# Patient Record
Sex: Male | Born: 1955 | Race: White | Hispanic: No | Marital: Married | Smoking: Never smoker
Health system: Southern US, Community
[De-identification: ages and names within clinical notes are randomized; demographics above are authoritative.]

## PROBLEM LIST (undated history)

## (undated) DIAGNOSIS — E119 Type 2 diabetes mellitus without complications: Secondary | ICD-10-CM

## (undated) DIAGNOSIS — E785 Hyperlipidemia, unspecified: Secondary | ICD-10-CM

---

## 2021-02-19 ENCOUNTER — Other Ambulatory Visit: Payer: Self-pay

## 2021-02-19 ENCOUNTER — Encounter (HOSPITAL_BASED_OUTPATIENT_CLINIC_OR_DEPARTMENT_OTHER): Payer: Self-pay

## 2021-02-19 DIAGNOSIS — N201 Calculus of ureter: Secondary | ICD-10-CM | POA: Diagnosis present

## 2021-02-19 DIAGNOSIS — K5730 Diverticulosis of large intestine without perforation or abscess without bleeding: Secondary | ICD-10-CM | POA: Insufficient documentation

## 2021-02-19 DIAGNOSIS — E119 Type 2 diabetes mellitus without complications: Secondary | ICD-10-CM | POA: Insufficient documentation

## 2021-02-19 DIAGNOSIS — N281 Cyst of kidney, acquired: Secondary | ICD-10-CM | POA: Insufficient documentation

## 2021-02-19 DIAGNOSIS — F17210 Nicotine dependence, cigarettes, uncomplicated: Secondary | ICD-10-CM | POA: Insufficient documentation

## 2021-02-19 DIAGNOSIS — N132 Hydronephrosis with renal and ureteral calculous obstruction: Secondary | ICD-10-CM | POA: Insufficient documentation

## 2021-02-19 NOTE — ED Triage Notes (Signed)
Bilateral flank pain for months.

## 2021-02-20 ENCOUNTER — Ambulatory Visit (HOSPITAL_COMMUNITY)
Admission: AD | Admit: 2021-02-20 | Discharge: 2021-02-20 | Disposition: A | Discharge status: Home / Self Care | Payer: 59 | Source: Ambulatory Visit | Attending: Urology | Admitting: Urology

## 2021-02-20 ENCOUNTER — Encounter (HOSPITAL_COMMUNITY)
Admission: AD | Disposition: A | Discharge status: Home / Self Care | Payer: Self-pay | Source: Ambulatory Visit | Attending: Urology

## 2021-02-20 ENCOUNTER — Ambulatory Visit (HOSPITAL_COMMUNITY): Payer: 59

## 2021-02-20 ENCOUNTER — Other Ambulatory Visit: Payer: Self-pay | Admitting: Urology

## 2021-02-20 ENCOUNTER — Encounter (HOSPITAL_COMMUNITY): Payer: Self-pay | Admitting: Urology

## 2021-02-20 ENCOUNTER — Emergency Department (HOSPITAL_BASED_OUTPATIENT_CLINIC_OR_DEPARTMENT_OTHER)
Admission: EM | Admit: 2021-02-20 | Discharge: 2021-02-20 | Disposition: A | Discharge status: Home / Self Care | Payer: 59 | Source: Home / Self Care | Attending: Emergency Medicine | Admitting: Emergency Medicine

## 2021-02-20 ENCOUNTER — Emergency Department (HOSPITAL_BASED_OUTPATIENT_CLINIC_OR_DEPARTMENT_OTHER): Payer: 59

## 2021-02-20 DIAGNOSIS — E119 Type 2 diabetes mellitus without complications: Secondary | ICD-10-CM | POA: Diagnosis not present

## 2021-02-20 DIAGNOSIS — N200 Calculus of kidney: Secondary | ICD-10-CM

## 2021-02-20 DIAGNOSIS — N281 Cyst of kidney, acquired: Secondary | ICD-10-CM | POA: Insufficient documentation

## 2021-02-20 DIAGNOSIS — K5730 Diverticulosis of large intestine without perforation or abscess without bleeding: Secondary | ICD-10-CM | POA: Diagnosis not present

## 2021-02-20 DIAGNOSIS — N132 Hydronephrosis with renal and ureteral calculous obstruction: Secondary | ICD-10-CM | POA: Diagnosis not present

## 2021-02-20 DIAGNOSIS — N201 Calculus of ureter: Secondary | ICD-10-CM

## 2021-02-20 HISTORY — DX: Type 2 diabetes mellitus without complications: E11.9

## 2021-02-20 HISTORY — DX: Hyperlipidemia, unspecified: E78.5

## 2021-02-20 HISTORY — PX: CYSTOSCOPY/URETEROSCOPY/HOLMIUM LASER/STENT PLACEMENT: SHX6546

## 2021-02-20 LAB — URINALYSIS, ROUTINE W REFLEX MICROSCOPIC
Bilirubin Urine: NEGATIVE
Glucose, UA: NEGATIVE mg/dL
Ketones, ur: NEGATIVE mg/dL
Leukocytes,Ua: NEGATIVE
Nitrite: NEGATIVE
Protein, ur: NEGATIVE mg/dL
Specific Gravity, Urine: 1.009 (ref 1.005–1.030)
pH: 5.5 (ref 5.0–8.0)

## 2021-02-20 LAB — CBC WITH DIFFERENTIAL/PLATELET
Abs Immature Granulocytes: 0.03 10*3/uL (ref 0.00–0.07)
Basophils Absolute: 0 10*3/uL (ref 0.0–0.1)
Basophils Relative: 0 %
Eosinophils Absolute: 0 10*3/uL (ref 0.0–0.5)
Eosinophils Relative: 0 %
HCT: 38.2 % — ABNORMAL LOW (ref 39.0–52.0)
Hemoglobin: 12.9 g/dL — ABNORMAL LOW (ref 13.0–17.0)
Immature Granulocytes: 0 %
Lymphocytes Relative: 15 %
Lymphs Abs: 1.6 10*3/uL (ref 0.7–4.0)
MCH: 28.9 pg (ref 26.0–34.0)
MCHC: 33.8 g/dL (ref 30.0–36.0)
MCV: 85.7 fL (ref 80.0–100.0)
Monocytes Absolute: 1 10*3/uL (ref 0.1–1.0)
Monocytes Relative: 10 %
Neutro Abs: 7.7 10*3/uL (ref 1.7–7.7)
Neutrophils Relative %: 75 %
Platelets: 152 10*3/uL (ref 150–400)
RBC: 4.46 MIL/uL (ref 4.22–5.81)
RDW: 12.8 % (ref 11.5–15.5)
WBC: 10.3 10*3/uL (ref 4.0–10.5)
nRBC: 0 % (ref 0.0–0.2)

## 2021-02-20 LAB — COMPREHENSIVE METABOLIC PANEL
ALT: 29 U/L (ref 0–44)
AST: 19 U/L (ref 15–41)
Albumin: 5 g/dL (ref 3.5–5.0)
Alkaline Phosphatase: 60 U/L (ref 38–126)
Anion gap: 12 (ref 5–15)
BUN: 22 mg/dL (ref 8–23)
CO2: 34 mmol/L — ABNORMAL HIGH (ref 22–32)
Calcium: 10 mg/dL (ref 8.9–10.3)
Chloride: 92 mmol/L — ABNORMAL LOW (ref 98–111)
Creatinine, Ser: 1.52 mg/dL — ABNORMAL HIGH (ref 0.61–1.24)
GFR, Estimated: 51 mL/min — ABNORMAL LOW (ref >60)
Glucose, Bld: 108 mg/dL — ABNORMAL HIGH (ref 70–99)
Potassium: 3.1 mmol/L — ABNORMAL LOW (ref 3.5–5.1)
Sodium: 138 mmol/L (ref 135–145)
Total Bilirubin: 1.5 mg/dL — ABNORMAL HIGH (ref 0.3–1.2)
Total Protein: 8.1 g/dL (ref 6.5–8.1)

## 2021-02-20 LAB — GLUCOSE, CAPILLARY
Glucose-Capillary: 151 mg/dL — ABNORMAL HIGH (ref 70–99)
Glucose-Capillary: 155 mg/dL — ABNORMAL HIGH (ref 70–99)

## 2021-02-20 LAB — LIPASE, BLOOD: Lipase: 40 U/L (ref 11–51)

## 2021-02-20 SURGERY — CYSTOSCOPY/URETEROSCOPY/HOLMIUM LASER/STENT PLACEMENT
Anesthesia: General | Site: Ureter | Laterality: Left

## 2021-02-20 MED ORDER — TAMSULOSIN HCL 0.4 MG PO CAPS: 0.4000 mg | ORAL_CAPSULE | Freq: Every day | ORAL | 0 refills | Status: AC

## 2021-02-20 MED ORDER — ONDANSETRON HCL 4 MG/2ML IJ SOLN
INTRAMUSCULAR | Status: DC | PRN
  Administered 2021-02-20: 4 mg via INTRAVENOUS

## 2021-02-20 MED ORDER — PROPOFOL 10 MG/ML IV BOLUS
INTRAVENOUS | Status: DC | PRN
  Administered 2021-02-20: 140 mg via INTRAVENOUS

## 2021-02-20 MED ORDER — DEXAMETHASONE SODIUM PHOSPHATE 10 MG/ML IJ SOLN
INTRAMUSCULAR | Status: DC | PRN
  Administered 2021-02-20: 10 mg via INTRAVENOUS

## 2021-02-20 MED ORDER — SODIUM CHLORIDE 0.9% FLUSH: 3.0000 mL | Freq: Two times a day (BID) | INTRAVENOUS | Status: DC

## 2021-02-20 MED ORDER — OXYCODONE HCL 5 MG PO TABS: 5.0000 mg | ORAL_TABLET | Freq: Once | ORAL | Status: DC | PRN

## 2021-02-20 MED ORDER — LIDOCAINE 2% (20 MG/ML) 5 ML SYRINGE
INTRAMUSCULAR | Status: DC | PRN
  Administered 2021-02-20: 100 mg via INTRAVENOUS

## 2021-02-20 MED ORDER — LACTATED RINGERS IV SOLN: INTRAVENOUS | Status: DC

## 2021-02-20 MED ORDER — ONDANSETRON HCL 4 MG/2ML IJ SOLN: 4.0000 mg | Freq: Once | INTRAMUSCULAR | Status: DC | PRN

## 2021-02-20 MED ORDER — ACETAMINOPHEN 10 MG/ML IV SOLN: 1000.0000 mg | Freq: Once | INTRAVENOUS | Status: DC | PRN

## 2021-02-20 MED ORDER — FENTANYL CITRATE (PF) 100 MCG/2ML IJ SOLN
INTRAMUSCULAR | Status: AC
  Filled 2021-02-20: qty 2

## 2021-02-20 MED ORDER — CEPHALEXIN 500 MG PO CAPS: 500.0000 mg | ORAL_CAPSULE | Freq: Three times a day (TID) | ORAL | 0 refills | Status: AC

## 2021-02-20 MED ORDER — CEFAZOLIN SODIUM-DEXTROSE 2-4 GM/100ML-% IV SOLN
2.0000 g | INTRAVENOUS | Status: AC
  Administered 2021-02-20: 2 g via INTRAVENOUS
  Filled 2021-02-20: qty 100

## 2021-02-20 MED ORDER — OXYCODONE-ACETAMINOPHEN 5-325 MG PO TABS: 1.0000 | ORAL_TABLET | Freq: Four times a day (QID) | ORAL | 0 refills | Status: AC | PRN

## 2021-02-20 MED ORDER — FENTANYL CITRATE PF 50 MCG/ML IJ SOSY
25.0000 ug | PREFILLED_SYRINGE | INTRAMUSCULAR | Status: DC | PRN
Start: 2021-02-20 — End: 2021-02-21

## 2021-02-20 MED ORDER — OXYCODONE HCL 5 MG/5ML PO SOLN: 5.0000 mg | Freq: Once | ORAL | Status: DC | PRN

## 2021-02-20 MED ORDER — SUCCINYLCHOLINE CHLORIDE 200 MG/10ML IV SOSY
PREFILLED_SYRINGE | INTRAVENOUS | Status: DC | PRN
  Administered 2021-02-20: 120 mg via INTRAVENOUS

## 2021-02-20 MED ORDER — MIDAZOLAM HCL 2 MG/2ML IJ SOLN
INTRAMUSCULAR | Status: AC
  Filled 2021-02-20: qty 2

## 2021-02-20 MED ORDER — CHLORHEXIDINE GLUCONATE 0.12 % MT SOLN
15.0000 mL | OROMUCOSAL | Status: AC
  Administered 2021-02-20: 15 mL via OROMUCOSAL

## 2021-02-20 MED ORDER — OXYCODONE-ACETAMINOPHEN 5-325 MG PO TABS
1.0000 | ORAL_TABLET | Freq: Once | ORAL | Status: AC
  Administered 2021-02-20: 1 via ORAL
  Filled 2021-02-20: qty 1

## 2021-02-20 MED ORDER — SODIUM CHLORIDE 0.9 % IR SOLN
Status: DC | PRN
  Administered 2021-02-20: 3000 mL
  Administered 2021-02-20: 6000 mL

## 2021-02-20 MED ORDER — MIDAZOLAM HCL 2 MG/2ML IJ SOLN
INTRAMUSCULAR | Status: DC | PRN
Start: 2021-02-20 — End: 2021-02-20
  Administered 2021-02-20: 2 mg via INTRAVENOUS

## 2021-02-20 MED ORDER — PHENYLEPHRINE HCL-NACL 20-0.9 MG/250ML-% IV SOLN
INTRAVENOUS | Status: DC | PRN
  Administered 2021-02-20: 50 ug/min via INTRAVENOUS

## 2021-02-20 MED ORDER — EPHEDRINE SULFATE-NACL 50-0.9 MG/10ML-% IV SOSY
PREFILLED_SYRINGE | INTRAVENOUS | Status: DC | PRN
  Administered 2021-02-20: 5 mg via INTRAVENOUS

## 2021-02-20 MED ORDER — PROPOFOL 10 MG/ML IV BOLUS
INTRAVENOUS | Status: AC
  Filled 2021-02-20: qty 20

## 2021-02-20 MED ORDER — FENTANYL CITRATE (PF) 100 MCG/2ML IJ SOLN
INTRAMUSCULAR | Status: DC | PRN
  Administered 2021-02-20 (×2): 50 ug via INTRAVENOUS

## 2021-02-20 MED ORDER — IOHEXOL 300 MG/ML  SOLN
INTRAMUSCULAR | Status: DC | PRN
  Administered 2021-02-20: 5 mL

## 2021-02-20 SURGICAL SUPPLY — 23 items
BAG URO CATCHER STRL LF (MISCELLANEOUS) ×2 IMPLANT
BASKET STONE NCOMPASS (UROLOGICAL SUPPLIES) IMPLANT
CATH URETERAL DUAL LUMEN 10F (MISCELLANEOUS) IMPLANT
CATH URETL OPEN 5X70 (CATHETERS) ×2 IMPLANT
CLOTH BEACON ORANGE TIMEOUT ST (SAFETY) ×2 IMPLANT
EXTRACTOR STONE NITINOL NGAGE (UROLOGICAL SUPPLIES) ×2 IMPLANT
GLOVE SURG POLYISO LF SZ8 (GLOVE) ×2 IMPLANT
GOWN STRL REUS W/TWL XL LVL3 (GOWN DISPOSABLE) ×2 IMPLANT
GUIDEWIRE STR DUAL SENSOR (WIRE) ×4 IMPLANT
IV NS IRRIG 3000ML ARTHROMATIC (IV SOLUTION) ×2 IMPLANT
KIT TURNOVER KIT A (KITS) IMPLANT
LASER FIB FLEXIVA PULSE ID 365 (Laser) ×2 IMPLANT
LASER FIB FLEXIVA PULSE ID 550 (Laser) IMPLANT
LASER FIB FLEXIVA PULSE ID 910 (Laser) IMPLANT
MANIFOLD NEPTUNE II (INSTRUMENTS) ×2 IMPLANT
PACK CYSTO (CUSTOM PROCEDURE TRAY) ×2 IMPLANT
SHEATH NAVIGATOR HD 11/13X36 (SHEATH) IMPLANT
SHEATH URETERAL 12FR 45CM (SHEATH) ×2 IMPLANT
STENT URET 6FRX24 CONTOUR (STENTS) ×2 IMPLANT
TRACTIP FLEXIVA PULS ID 200XHI (Laser) IMPLANT
TRACTIP FLEXIVA PULSE ID 200 (Laser)
TUBING CONNECTING 10 (TUBING) ×2 IMPLANT
TUBING UROLOGY SET (TUBING) ×2 IMPLANT

## 2021-02-20 NOTE — Anesthesia Preprocedure Evaluation (Addendum)
Anesthesia Evaluation  Patient identified by MRN, date of birth, ID band Patient awake    Reviewed: Allergy & Precautions, H&P , NPO status , Patient's Chart, lab work & pertinent test results  Airway Mallampati: II  TM Distance: >3 FB Neck ROM: Full    Dental no notable dental hx.    Pulmonary neg pulmonary ROS,    Pulmonary exam normal breath sounds clear to auscultation       Cardiovascular negative cardio ROS Normal cardiovascular exam Rhythm:Regular Rate:Normal     Neuro/Psych negative neurological ROS  negative psych ROS   GI/Hepatic negative GI ROS, Neg liver ROS,   Endo/Other  diabetes  Renal/GU Renal diseaseAcute renal injury causing increased Cr  negative genitourinary   Musculoskeletal negative musculoskeletal ROS (+)   Abdominal   Peds negative pediatric ROS (+)  Hematology negative hematology ROS (+)   Anesthesia Other Findings   Reproductive/Obstetrics negative OB ROS                            Anesthesia Physical Anesthesia Plan  ASA: 2  Anesthesia Plan: General   Post-op Pain Management:    Induction: Intravenous  PONV Risk Score and Plan: 2 and Ondansetron, Dexamethasone and Treatment may vary due to age or medical condition  Airway Management Planned: Oral ETT  Additional Equipment:   Intra-op Plan:   Post-operative Plan: Extubation in OR  Informed Consent: I have reviewed the patients History and Physical, chart, labs and discussed the procedure including the risks, benefits and alternatives for the proposed anesthesia with the patient or authorized representative who has indicated his/her understanding and acceptance.     Dental advisory given  Plan Discussed with: CRNA and Surgeon  Anesthesia Plan Comments: (Solids at 1000)       Anesthesia Quick Evaluation

## 2021-02-20 NOTE — ED Notes (Signed)
Pt states he has had bilateral flank pain for a few months. States today has been the worst the pain has been and is constant.  Normally states it comes and goes.  Urine obtained and lab work has been sent

## 2021-02-20 NOTE — Anesthesia Procedure Notes (Signed)
Procedure Name: Intubation Date/Time: 02/20/2021 8:10 PM Performed by: Illene Silver, CRNA Pre-anesthesia Checklist: Patient identified, Emergency Drugs available, Suction available and Patient being monitored Patient Re-evaluated:Patient Re-evaluated prior to induction Oxygen Delivery Method: Circle system utilized Preoxygenation: Pre-oxygenation with 100% oxygen Induction Type: IV induction, Rapid sequence and Cricoid Pressure applied Ventilation: Mask ventilation without difficulty Laryngoscope Size: 2 and Miller Grade View: Grade I Tube type: Oral Number of attempts: 1 Airway Equipment and Method: Stylet Placement Confirmation: ETT inserted through vocal cords under direct vision, positive ETCO2 and breath sounds checked- equal and bilateral Secured at: 22 cm Tube secured with: Tape Dental Injury: Teeth and Oropharynx as per pre-operative assessment  Comments: Smooth IV induction Rose- intubation AM CRNA atraumatic--- teeth and mouth as preop bilat BS

## 2021-02-20 NOTE — H&P (Signed)
I have ureteral stone.  HPI: Brendan Sandoval is a 65 year-old male patient who was referred by Dr. Thayer Jew, MD who is here for ureteral stone.    Brendan Sandoval is a 65 yo male who has had a 2 mo history of intermittent left flank pain. Yesterday he had more severe pain that became progressively worse despite pain med. He had nausea without vomiting. he had no fever. He had no hematuria. He has had no prior stones or GU surgery but he may have had a UTI remotely. He has an IPSS of 18 with some frequency and nocturia. He drinks a lot of fluid in the morning. His Cr was elevated at 1.52 in the ER. His baseline in 9/22 was 1.15. His UA is clear.      ALLERGIES: None   MEDICATIONS: Crestor  Janumet     GU PSH: None   NON-GU PSH: None   GU PMH: None   NON-GU PMH: Diabetes Type 2 Hypercholesterolemia    FAMILY HISTORY: 2 sons - Other 4 daughters - Other Death In The Family Father - Other Death In The Family Mother - Other   SOCIAL HISTORY: Marital Status: Married Preferred Language: English Current Smoking Status: Patient has never smoked.   Tobacco Use Assessment Completed: Used Tobacco in last 30 days? Does not use smokeless tobacco. Has never drank.  Does not use drugs. Drinks 2 caffeinated drinks per day. Patient's occupation Company secretary.    REVIEW OF SYSTEMS:    GU Review Male:   Patient reports frequent urination and get up at night to urinate. Patient denies hard to postpone urination, burning/ pain with urination, leakage of urine, stream starts and stops, trouble starting your stream, have to strain to urinate , erection problems, and penile pain.  Gastrointestinal (Upper):   Patient denies nausea, vomiting, and indigestion/ heartburn.  Gastrointestinal (Lower):   Patient denies diarrhea and constipation.  Constitutional:   Patient denies fever, night sweats, weight loss, and fatigue.  Skin:   Patient denies skin rash/ lesion and itching.  Eyes:   Patient denies blurred  vision and double vision.  Ears/ Nose/ Throat:   Patient denies sore throat and sinus problems.  Hematologic/Lymphatic:   Patient denies swollen glands and easy bruising.  Cardiovascular:   Patient denies leg swelling and chest pains.  Respiratory:   Patient denies shortness of breath and cough.  Endocrine:   Patient denies excessive thirst.  Musculoskeletal:   Patient reports back pain. Patient denies joint pain.  Neurological:   Patient denies headaches and dizziness.  Psychologic:   Patient denies depression and anxiety.   Notes: Weak stream    VITAL SIGNS:      02/20/2021 11:13 AM  Weight 132 lb / 59.87 kg  Height 66 in / 167.64 cm  BP 126/77 mmHg  Heart Rate 70 /min  Temperature 97.7 F / 36.5 C  BMI 21.3 kg/m   MULTI-SYSTEM PHYSICAL EXAMINATION:    Constitutional: Well-nourished. No physical deformities. Normally developed. Good grooming.  Neck: Neck symmetrical, not swollen. Normal tracheal position.  Respiratory: Normal breath sounds. No labored breathing, no use of accessory muscles.   Cardiovascular: Regular rate and rhythm. No murmur, no gallop.   Lymphatic: No enlargement of neck, axillae, groin.  Skin: No paleness, no jaundice, no cyanosis. No lesion, no ulcer, no rash.  Neurologic / Psychiatric: Oriented to time, oriented to place, oriented to person. No depression, no anxiety, no agitation.  Gastrointestinal: No mass, no tenderness, no rigidity, non  obese abdomen.   Musculoskeletal: Normal gait and station of head and neck.     Complexity of Data:  Lab Test Review:   BMP, CBC with Diff  Records Review:   AUA Symptom Score, Previous Patient Records  Urine Test Review:   Urinalysis  X-Ray Review: C.T. Stone Protocol: Reviewed Films. Reviewed Report. Discussed With Patient.    Notes:                     his K was was 3.1 and his Hgb was 12.9.    PROCEDURES: None   ASSESSMENT:      ICD-10 Details  1 GU:   Ureteral calculus - N20.1 Left, Acute, Systemic  Symptoms - He has a 41mm left mid ureteral stone with obstruction and AKI. I discussed options including MET, ESWL and URS and will get him set up for URS today. I have reviewed the risks of ureteroscopy including bleeding, infection, ureteral injury, need for a stent or secondary procedures, thrombotic events and anesthetic complications.    PLAN:           Schedule Procedure: 02/20/2021 - Cysto Uretero Lithotripsy - [id number], left          Document Letter(s):  Created for Patient: Clinical Summary         Next Appointment:      Next Appointment: 03/01/2021 02:45 PM    Appointment Type: Postoperative Appointment    Location: [facility] [phone]    Provider: Jiles Crocker, NP    Reason for Visit: POST OP

## 2021-02-20 NOTE — Discharge Instructions (Signed)
Please bring the stone fragments to the office.  We will need to remove the stent when you come to the office on 12/9.   The stone was quite stuck and was associated with some inflammation so the stent needs to stay in until your return.  Please take the tamsulosin you were given as that can help with stent discomfort.

## 2021-02-20 NOTE — ED Provider Notes (Signed)
[facility] EMERGENCY DEPT Provider Note   CSN: [id number] Arrival date & time: 02/19/21  1955     History Chief Complaint  Patient presents with   Flank Pain    Brendan Sandoval is a 65 y.o. male.  HPI     This is a 65 year old male with history of diabetes who presents with flank pain.  Patient reports he has had on and off flank pain over the last 1 month.  He states it has been bilateral but more on the left side than the right.  It comes and goes.  However over the last 24 hours it has been increasingly worse.  He states it radiates into the abdomen.  Has not noted any hematuria or dysuria.  He states he cannot get comfortable in any position.  He does not think that movement or position changes make his symptoms better or worse.  He denies fevers.  Currently rates pain at 8 out of 10.  Past Medical History:  Diagnosis Date   Diabetes mellitus without complication ([place])    Hyperlipidemia     There are no problems to display for this patient.   History reviewed. No pertinent surgical history.     No family history on file.  Social History   Tobacco Use   Smoking status: Never  Substance Use Topics   Alcohol use: Never   Drug use: Never    Home Medications Prior to Admission medications   Medication Sig Start Date End Date Taking? Authorizing Provider  oxyCODONE-acetaminophen (PERCOCET/ROXICET) 5-325 MG tablet Take 1 tablet by mouth every 6 (six) hours as needed for severe pain. 02/20/21  Yes Heylee Tant, Barbette Hair, MD  tamsulosin (FLOMAX) 0.4 MG CAPS capsule Take 1 capsule (0.4 mg total) by mouth daily. 02/20/21  Yes Frank Pilger, Barbette Hair, MD    Allergies    Patient has no known allergies.  Review of Systems   Review of Systems  Constitutional:  Negative for fever.  Respiratory:  Negative for shortness of breath.   Cardiovascular:  Negative for chest pain.  Gastrointestinal:  Positive for abdominal pain. Negative for nausea and vomiting.   Genitourinary:  Positive for flank pain. Negative for dysuria and hematuria.  All other systems reviewed and are negative.  Physical Exam Updated Vital Signs BP 129/69 (BP Location: Right Arm)   Pulse 61   Temp 98.5 F (36.9 C)   Resp 16   Ht 1.676 m (5\' 6" )   Wt 65.8 kg   SpO2 98%   BMI 23.40 kg/m   Physical Exam Vitals and nursing note reviewed.  Constitutional:      Appearance: He is well-developed. He is not ill-appearing.  HENT:     Head: Normocephalic and atraumatic.     Nose: Nose normal.     Mouth/Throat:     Mouth: Mucous membranes are moist.  Eyes:     Pupils: Pupils are equal, round, and reactive to light.  Cardiovascular:     Rate and Rhythm: Normal rate and regular rhythm.     Heart sounds: Normal heart sounds. No murmur heard. Pulmonary:     Effort: Pulmonary effort is normal. No respiratory distress.     Breath sounds: Normal breath sounds. No wheezing.  Abdominal:     Palpations: Abdomen is soft.     Tenderness: There is no abdominal tenderness. There is left CVA tenderness. There is no right CVA tenderness or rebound.  Musculoskeletal:     Cervical back: Neck supple.  Lymphadenopathy:  Cervical: No cervical adenopathy.  Skin:    General: Skin is warm and dry.  Neurological:     Mental Status: He is alert and oriented to person, place, and time.  Psychiatric:        Mood and Affect: Mood normal.    ED Results / Procedures / Treatments   Labs (all labs ordered are listed, but only abnormal results are displayed) Labs Reviewed  CBC WITH DIFFERENTIAL/PLATELET - Abnormal; Notable for the following components:      Result Value   Hemoglobin 12.9 (*)    HCT 38.2 (*)    All other components within normal limits  URINALYSIS, ROUTINE W REFLEX MICROSCOPIC - Abnormal; Notable for the following components:   Color, Urine COLORLESS (*)    Hgb urine dipstick SMALL (*)    All other components within normal limits  COMPREHENSIVE METABOLIC PANEL -  Abnormal; Notable for the following components:   Potassium 3.1 (*)    Chloride 92 (*)    CO2 34 (*)    Glucose, Bld 108 (*)    Creatinine, Ser 1.52 (*)    Total Bilirubin 1.5 (*)    GFR, Estimated 51 (*)    All other components within normal limits  LIPASE, BLOOD    EKG None  Radiology CT Renal Stone Study  Result Date: 02/20/2021 CLINICAL DATA:  Left flank pain. EXAM: CT ABDOMEN AND PELVIS WITHOUT CONTRAST TECHNIQUE: Multidetector CT imaging of the abdomen and pelvis was performed following the standard protocol without IV contrast. COMPARISON:  None. FINDINGS: Lower chest: There is a 4 mm noncalcified pleural-based left lower lobe nodule laterally on the first image. Lung bases are otherwise clear. The cardiac size is normal. Hepatobiliary: 18.3 cm in length slightly steatotic and otherwise unremarkable without contrast. The gallbladder and bile ducts are unremarkable. Pancreas: Unremarkable without contrast. Spleen: Mildly prominent measuring 13.4 cm AP and otherwise are without contrast. Adrenals/Urinary Tract: There is no adrenal mass. No focal abnormality of unenhanced renal cortex aside from a 2.2 cm cyst of the right inferior pole of 8.6 Hounsfield units, a 1.7 cm cyst in the superior pole left kidney of 11.5 Hounsfield units, and a 1.1 cm cyst in the inferior pole left kidney of 5.9 Hounsfield units. On the left there is perinephric edema and moderate hydroureteronephrosis above a 6.6 x 5 by 7 mm stone in the pelvic left ureter 5-6 cm proximal to the UVJ, with mild periureteral and peripelvic edema. There are no other visible urinary stones. There is mild bladder thickening versus nondistention. No focal bladder thickening is seen. Stomach/Bowel: No dilatation or wall thickening including the appendix. Moderate fecal stasis. Left colonic diverticulosis is seen without evidence of acute diverticulitis. Vascular/Lymphatic: No significant vascular findings are present. No enlarged abdominal  or pelvic lymph nodes. Reproductive: Enlarged prostate, 5.3 cm transverse with bladder base impression. Other: There is trace retroperitoneal fluid on the left which is probably related to obstructive uropathy. Possible ureteral leak could cause this as well but there is no further free fluid. There is a small umbilical fat hernia. There is no free air or free hemorrhage Musculoskeletal: No worrisome regional bone lesions. There is a bone island in the left femoral head. There are mild degenerative changes in the lumbar spine. IMPRESSION: 1. 6.6 x 5 x 7 mm left ureteral stone in the pelvis 5-6 cm proximal to the UVJ. 2. Moderate upstream obstructive uropathy with peripelvic and periureteral edema with perinephric edema. Please correlate clinically for infectious complication. 3.  Trace left retroperitoneal fluid which could be due to the obstructive uropathy but could also be seen with a ureteral leak, although the fluid volume should be greater with a ureteral leak. 4. Prostatomegaly with bladder base impression and mild bladder thickening versus nondistention. 5. Constipation with diverticulosis. 6. Renal cysts.  No other urinary stone is seen in either side. 7. Mildly prominent liver and spleen. 8. 4 mm noncalcified left lower lobe nodule. No imaging follow-up is required in a low risk patient. In a high-risk patient, optional 12 month follow-up CT could be considered to ensure stability. Electronically Signed   By: Telford Nab M.D.   On: 02/20/2021 02:47    Procedures Procedures   Medications Ordered in ED Medications  oxyCODONE-acetaminophen (PERCOCET/ROXICET) 5-325 MG per tablet 1 tablet (1 tablet Oral Given 02/20/21 0039)    ED Course  I have reviewed the triage vital signs and the nursing notes.  Pertinent labs & imaging results that were available during my care of the patient were reviewed by me and considered in my medical decision making (see chart for details).  Clinical Course as of  02/20/21 0325  Wed Feb 20, 2021  0309 Patient feels much improved. [CH]    Clinical Course User Index [CH] Bartley Vuolo, Barbette Hair, MD   MDM Rules/Calculators/A&P                           Patient presents with flank pain.  Left greater than right.  He is overall nontoxic and vital signs reassuring.  He has some left CVA tenderness.  Considerations include but not limited to, UTI or pyelonephritis, kidney stone, musculoskeletal etiology.  Labs obtained.  Labs are largely unremarkable.  He does have a creatinine of 1.5 without unknown baseline.  CT stone study obtained.  CT shows a 6.6 x 5 x 7 mm left ureteral stone with some obstructive uropathy and perinephric stranding.  No evidence of UTI on his urinalysis.  He has some trace retroperitoneal fluid.  I favor obstructive uropathy versus ureteral leak as patient is very clinically well-appearing.  On recheck, he states he feels much better with pain medication.  Discussed the CT findings with the patient.  Recommend close outpatient follow-up with urology.  In the meantime will start on Flomax and pain medication.  Patient stated understanding.  After history, exam, and medical workup I feel the patient has been appropriately medically screened and is safe for discharge home. Pertinent diagnoses were discussed with the patient. Patient was given return precautions.  Final Clinical Impression(s) / ED Diagnoses Final diagnoses:  Kidney stone    Rx / DC Orders ED Discharge Orders          Ordered    tamsulosin (FLOMAX) 0.4 MG CAPS capsule  Daily        02/20/21 0311    oxyCODONE-acetaminophen (PERCOCET/ROXICET) 5-325 MG tablet  Every 6 hours PRN        02/20/21 0311             Merryl Hacker, MD 02/20/21 [phone]

## 2021-02-20 NOTE — Op Note (Signed)
Procedure: 1.  Cystoscopy with left retrograde pyelogram and interpretation. 2.  Left ureteroscopic stone extraction with holmium laser application and insertion of left double-J stent. 3.  Application of fluoroscopy.  Preop diagnosis: 7 mm left mid ureteral stone.  Postop diagnosis: Same.  Surgeon: Dr. Bjorn Pippin.  Anesthesia: General.  Specimen: Stone fragments.  Drain: 6 [place] by 24 cm left double-J stent without tether.  EBL: None.  Complications: None.  Indications: The patient is a 65 year old male who was seen in the office today for 7 mm obstructing left mid ureteral stone with intermittent pain and AKI.  He had had intermittent bouts of pain for about 2 months and on CT had moderate hydronephrosis and evidence of possible periureteral extravasation.  I reviewed the options for treatment and elected to proceed with ureteroscopy today.  Procedure: He was taken operating room was given 2 g of Ancef.  A general anesthetic was induced.  He was placed in lithotomy position and fitted with PAS hose.  His perineum and genitalia were prepped with Betadine solution he was draped in usual sterile fashion.  Cystoscopy was performed using a 21 [place] scope and 30 degree lens.  Examination revealed a normal urethra.  The external sphincter was intact.  The prostatic urethra short bilobar hyperplasia without significant obstruction.  The bladder wall was smooth and pale without tumors, stones or inflammation.  There was a small amount of debris that looked like old blood floating in the bladder.  The ureteral orifices were unremarkable.  The left ureteral orifice was cannulated with a 5 [place] open-ended catheter and contrast was instilled.  The left retrograde pyelogram demonstrated a normal caliber distal ureter up to the level of the stone which was quite impacted and did not allow proximal reflux of contrast.  A sensor wire was then advanced through the open-ended catheter and I was able  to negotiate this by the stone to the kidney but I could not pass the open-ended catheter by the impacted stone.  The 12 [place] inner core of the ureteral access sheath was then passed over the wire after the cystoscope was then removed and the distal ureter was dilated but once again I could only get to the stone and not by it.  The dual-lumen 6.5 French short semirigid ureteroscope was then passed per urethra alongside the wire up the ureter.  The stone was visualized but it was at an angle in the ureter and was somewhat difficult to approach.  Upon inspection of the wire there was some concern that it may be have passed submucosally, so a second wire was then passed through the scope and by the stone under direct vision and advanced to the kidney.  Ureteroscope was then removed and replaced alongside both wires and the stone was more readily visualized.  A 365 m laser fiber was then passed and the Moses laser was set on the dusting setting with 0.3 J and 60 Hz on the left paddle and 0.8 J and 10 Hz on the right pedal.  The stone was then fragmented primarily using the dusting setting.  It was difficult to get the scope angled to send her on the stone, so worked up at the edges and eventually broken into fragments where I could push it more proximally in the ureter.  There was some inflammatory changes and mucosal irritation at the site of the stone impaction.  Eventually I removed one of the 2 wires to improve the available space and grasped the  stone with an engage basket to bring it into proximity with the ureter for further fragmentation.  The stone fragments were then removed using the engage basket to the bladder.  The bladder was drained intermittently.  Once final inspection demonstrated no significant residual stone fragments and also that the wire was not submucosal and appropriately positioned, the ureteroscope was removed.  The cystoscope was then reinserted over the wire and a 6 [place] by  24 cm contour double-J stent without tether was advanced the kidney under fluoroscopic guidance.  The wire was removed, leaving a good coil in the kidney and a good coil in the bladder.  The bladder was evacuated free of the stone fragments and the cystoscope was removed.  Final fluoroscopy revealed good positioning of the stent proximally and distally.  He was taken down from lithotomy position, his anesthetic was reversed and he was moved recovery in stable condition.  There were no complications.  He will be given the stone fragments to bring to the office for follow-up on 03/01/2021 at which time he will have cystoscopy to remove the stent.

## 2021-02-20 NOTE — Transfer of Care (Signed)
Immediate Anesthesia Transfer of Care Note  Patient: Butler A Goodspeed  Procedure(s) Performed: CYSTOSCOPY/RETROGRADE PYLOGRAM/URETEROSCOPY/HOLMIUM LASER/STENT PLACEMENT (Left: Ureter)  Patient Location: PACU  Anesthesia Type:General  Level of Consciousness: awake and alert   Airway & Oxygen Therapy: Patient Spontanous Breathing and Patient connected to face mask oxygen  Post-op Assessment: Report given to RN and Post -op Vital signs reviewed and stable  Post vital signs: Reviewed and stable  Last Vitals:  Vitals Value Taken Time  BP 140/75 02/20/21 2115  Temp    Pulse 88 02/20/21 2115  Resp 17 02/20/21 2115  SpO2 98 % 02/20/21 2115  Vitals shown include unvalidated device data.  Last Pain:  Vitals:   02/20/21 1641  TempSrc: Oral  PainSc: 5          Complications: No notable events documented.

## 2021-02-20 NOTE — Discharge Instructions (Addendum)
You were seen today for back pain.  You do have evidence of kidney stones.  Take medications as prescribed.  Make sure you are drinking plenty of fluids.  It is very important that you call the urology office for follow-up.

## 2021-02-21 ENCOUNTER — Encounter (HOSPITAL_COMMUNITY): Payer: Self-pay | Admitting: Urology

## 2021-02-22 NOTE — Anesthesia Postprocedure Evaluation (Signed)
Anesthesia Post Note  Patient: Brendan Sandoval  Procedure(s) Performed: CYSTOSCOPY/RETROGRADE PYLOGRAM/URETEROSCOPY/HOLMIUM LASER/STENT PLACEMENT (Left: Ureter)     Patient location during evaluation: PACU Anesthesia Type: General Level of consciousness: awake and alert Pain management: pain level controlled Vital Signs Assessment: post-procedure vital signs reviewed and stable Respiratory status: spontaneous breathing, nonlabored ventilation, respiratory function stable and patient connected to nasal cannula oxygen Cardiovascular status: blood pressure returned to baseline and stable Postop Assessment: no apparent nausea or vomiting Anesthetic complications: no   No notable events documented.  Last Vitals:  Vitals:   02/20/21 2200 02/20/21 2220  BP: 122/72 134/74  Pulse: 91 89  Resp: 20 18  Temp:  36.5 C  SpO2: 94% 95%    Last Pain:  Vitals:   02/20/21 2220  TempSrc:   PainSc: 0-No pain                 Toniyah Dilmore S

## 2022-06-04 ENCOUNTER — Emergency Department (HOSPITAL_COMMUNITY)
Admission: EM | Admit: 2022-06-04 | Discharge: 2022-06-05 | Disposition: A | Discharge status: Home / Self Care | Payer: Medicare Other | Attending: Physician Assistant | Admitting: Physician Assistant

## 2022-06-04 ENCOUNTER — Encounter (HOSPITAL_COMMUNITY): Payer: Self-pay | Admitting: Emergency Medicine

## 2022-06-04 ENCOUNTER — Other Ambulatory Visit: Payer: Self-pay

## 2022-06-04 DIAGNOSIS — I10 Essential (primary) hypertension: Secondary | ICD-10-CM | POA: Insufficient documentation

## 2022-06-04 DIAGNOSIS — R519 Headache, unspecified: Secondary | ICD-10-CM | POA: Diagnosis present

## 2022-06-04 DIAGNOSIS — Z5321 Procedure and treatment not carried out due to patient leaving prior to being seen by health care provider: Secondary | ICD-10-CM | POA: Diagnosis not present

## 2022-06-04 LAB — COMPREHENSIVE METABOLIC PANEL
ALT: 19 U/L (ref 0–44)
AST: 16 U/L (ref 15–41)
Albumin: 3.8 g/dL (ref 3.5–5.0)
Alkaline Phosphatase: 38 U/L (ref 38–126)
Anion gap: 13 (ref 5–15)
BUN: 18 mg/dL (ref 8–23)
CO2: 22 mmol/L (ref 22–32)
Calcium: 9.6 mg/dL (ref 8.9–10.3)
Chloride: 98 mmol/L (ref 98–111)
Creatinine, Ser: 1.38 mg/dL — ABNORMAL HIGH (ref 0.61–1.24)
GFR, Estimated: 57 mL/min — ABNORMAL LOW (ref >60)
Glucose, Bld: 248 mg/dL — ABNORMAL HIGH (ref 70–99)
Potassium: 4.2 mmol/L (ref 3.5–5.1)
Sodium: 133 mmol/L — ABNORMAL LOW (ref 135–145)
Total Bilirubin: 1.3 mg/dL — ABNORMAL HIGH (ref 0.3–1.2)
Total Protein: 7.1 g/dL (ref 6.5–8.1)

## 2022-06-04 LAB — CBC WITH DIFFERENTIAL/PLATELET
Abs Immature Granulocytes: 0.04 10*3/uL (ref 0.00–0.07)
Basophils Absolute: 0 10*3/uL (ref 0.0–0.1)
Basophils Relative: 0 %
Eosinophils Absolute: 0 10*3/uL (ref 0.0–0.5)
Eosinophils Relative: 0 %
HCT: 43.6 % (ref 39.0–52.0)
Hemoglobin: 15.3 g/dL (ref 13.0–17.0)
Immature Granulocytes: 0 %
Lymphocytes Relative: 18 %
Lymphs Abs: 2.2 10*3/uL (ref 0.7–4.0)
MCH: 30.4 pg (ref 26.0–34.0)
MCHC: 35.1 g/dL (ref 30.0–36.0)
MCV: 86.7 fL (ref 80.0–100.0)
Monocytes Absolute: 0.9 10*3/uL (ref 0.1–1.0)
Monocytes Relative: 7 %
Neutro Abs: 9.2 10*3/uL — ABNORMAL HIGH (ref 1.7–7.7)
Neutrophils Relative %: 75 %
Platelets: 178 10*3/uL (ref 150–400)
RBC: 5.03 MIL/uL (ref 4.22–5.81)
RDW: 13.2 % (ref 11.5–15.5)
WBC: 12.4 10*3/uL — ABNORMAL HIGH (ref 4.0–10.5)
nRBC: 0 % (ref 0.0–0.2)

## 2022-06-04 NOTE — ED Provider Triage Note (Cosign Needed)
Emergency Medicine Provider Triage Evaluation Note  Brendan Sandoval , a 67 y.o. male  was evaluated in triage.  Pt complains of headache which worsened today.  Reports he went to work this morning and got back home when he felt a right temporal tightness, states he noticed his blood pressure was more elevated than normal.  He was placed on steroids recently, he is concerned that this is likely raising his blood pressure along with his blood sugar.  Review of Systems  Positive: Headache Negative: Vision changes, neck pain  Physical Exam  BP (!) 149/90   Pulse 77   Temp 98.6 F (37 C)   Resp 16   Ht '5\' 6"'$  (1.676 m)   Wt 67.1 kg   SpO2 98%   BMI 23.89 kg/m  Gen:   Awake, no distress   Resp:  Normal effort  MSK:   Moves extremities without difficulty  Other:    Medical Decision Making  Medically screening exam initiated at 9:30 PM.  Appropriate orders placed.  Brendan Sandoval was informed that the remainder of the evaluation will be completed by another provider, this initial triage assessment does not replace that evaluation, and the importance of remaining in the ED until their evaluation is complete.     Brendan Fitting, PA-C 06/04/22 2138

## 2022-06-04 NOTE — ED Triage Notes (Addendum)
Pt states he took his bp at home and it was 180/87. Endorses ongoing headache x4 months. Pt describes headache as tension on right side. Pt scheduled to have bilateral temporal artery biopsy done on 3/15. Denies vision changes. Denies shob, chest pain. No neuro deficits.

## 2022-08-29 ENCOUNTER — Other Ambulatory Visit: Payer: Self-pay

## 2022-08-29 ENCOUNTER — Emergency Department (HOSPITAL_COMMUNITY)
Admission: EM | Admit: 2022-08-29 | Discharge: 2022-08-29 | Disposition: A | Discharge status: Home / Self Care | Payer: Medicare Other | Attending: Emergency Medicine | Admitting: Emergency Medicine

## 2022-08-29 ENCOUNTER — Emergency Department (HOSPITAL_COMMUNITY): Payer: Medicare Other

## 2022-08-29 ENCOUNTER — Encounter (HOSPITAL_COMMUNITY): Payer: Self-pay | Admitting: Emergency Medicine

## 2022-08-29 DIAGNOSIS — S065XAA Traumatic subdural hemorrhage with loss of consciousness status unknown, initial encounter: Secondary | ICD-10-CM

## 2022-08-29 DIAGNOSIS — R4781 Slurred speech: Secondary | ICD-10-CM | POA: Insufficient documentation

## 2022-08-29 DIAGNOSIS — I6200 Nontraumatic subdural hemorrhage, unspecified: Secondary | ICD-10-CM | POA: Diagnosis not present

## 2022-08-29 DIAGNOSIS — R531 Weakness: Secondary | ICD-10-CM | POA: Diagnosis present

## 2022-08-29 LAB — RAPID URINE DRUG SCREEN, HOSP PERFORMED
Amphetamines: NOT DETECTED
Barbiturates: NOT DETECTED
Benzodiazepines: NOT DETECTED
Cocaine: NOT DETECTED
Opiates: NOT DETECTED
Tetrahydrocannabinol: NOT DETECTED

## 2022-08-29 LAB — DIFFERENTIAL
Abs Immature Granulocytes: 0.01 10*3/uL (ref 0.00–0.07)
Basophils Absolute: 0 10*3/uL (ref 0.0–0.1)
Basophils Relative: 0 %
Eosinophils Absolute: 0.1 10*3/uL (ref 0.0–0.5)
Eosinophils Relative: 1 %
Immature Granulocytes: 0 %
Lymphocytes Relative: 28 %
Lymphs Abs: 2.2 10*3/uL (ref 0.7–4.0)
Monocytes Absolute: 0.6 10*3/uL (ref 0.1–1.0)
Monocytes Relative: 7 %
Neutro Abs: 5 10*3/uL (ref 1.7–7.7)
Neutrophils Relative %: 64 %

## 2022-08-29 LAB — CBC
HCT: 43.5 % (ref 39.0–52.0)
Hemoglobin: 14.5 g/dL (ref 13.0–17.0)
MCH: 29.8 pg (ref 26.0–34.0)
MCHC: 33.3 g/dL (ref 30.0–36.0)
MCV: 89.3 fL (ref 80.0–100.0)
Platelets: 166 10*3/uL (ref 150–400)
RBC: 4.87 MIL/uL (ref 4.22–5.81)
RDW: 12.7 % (ref 11.5–15.5)
WBC: 7.8 10*3/uL (ref 4.0–10.5)
nRBC: 0 % (ref 0.0–0.2)

## 2022-08-29 LAB — COMPREHENSIVE METABOLIC PANEL
ALT: 11 U/L (ref 0–44)
AST: 21 U/L (ref 15–41)
Albumin: 3.8 g/dL (ref 3.5–5.0)
Alkaline Phosphatase: 40 U/L (ref 38–126)
Anion gap: 10 (ref 5–15)
BUN: 11 mg/dL (ref 8–23)
CO2: 20 mmol/L — ABNORMAL LOW (ref 22–32)
Calcium: 8.9 mg/dL (ref 8.9–10.3)
Chloride: 106 mmol/L (ref 98–111)
Creatinine, Ser: 1.03 mg/dL (ref 0.61–1.24)
GFR, Estimated: >60 mL/min (ref >60)
Glucose, Bld: 156 mg/dL — ABNORMAL HIGH (ref 70–99)
Potassium: 4 mmol/L (ref 3.5–5.1)
Sodium: 136 mmol/L (ref 135–145)
Total Bilirubin: 0.7 mg/dL (ref 0.3–1.2)
Total Protein: 6.9 g/dL (ref 6.5–8.1)

## 2022-08-29 LAB — I-STAT CHEM 8, ED
BUN: 13 mg/dL (ref 8–23)
Calcium, Ion: 1 mmol/L — ABNORMAL LOW (ref 1.15–1.40)
Chloride: 106 mmol/L (ref 98–111)
Creatinine, Ser: 1 mg/dL (ref 0.61–1.24)
Glucose, Bld: 159 mg/dL — ABNORMAL HIGH (ref 70–99)
HCT: 42 % (ref 39.0–52.0)
Hemoglobin: 14.3 g/dL (ref 13.0–17.0)
Potassium: 4 mmol/L (ref 3.5–5.1)
Sodium: 140 mmol/L (ref 135–145)
TCO2: 24 mmol/L (ref 22–32)

## 2022-08-29 LAB — ETHANOL: Alcohol, Ethyl (B): <10 mg/dL (ref <10)

## 2022-08-29 LAB — PROTIME-INR
INR: 1 (ref 0.8–1.2)
Prothrombin Time: 13.5 seconds (ref 11.4–15.2)

## 2022-08-29 LAB — CBG MONITORING, ED: Glucose-Capillary: 155 mg/dL — ABNORMAL HIGH (ref 70–99)

## 2022-08-29 LAB — APTT: aPTT: 26 seconds (ref 24–36)

## 2022-08-29 MED ORDER — LEVETIRACETAM 500 MG PO TABS
500.0000 mg | ORAL_TABLET | Freq: Once | ORAL | Status: AC
  Administered 2022-08-29: 500 mg via ORAL
  Filled 2022-08-29: qty 1

## 2022-08-29 NOTE — Discharge Instructions (Addendum)
Your scan showed a worsening bleed from your baseline and after consult with neurosurgery they recommended repeating this to be sure it was stable. The repeat CT was stable so we are able to discharge you home but you should closely follow up with neurosurgery. Call their office today to discuss when you should follow up after your visit today. For any new or worsening symptoms, please return to nearest ED for re-evaluation.

## 2022-08-29 NOTE — ED Provider Notes (Signed)
[facility] Emergency Department Provider Note MRN:  [id number]  Arrival date & time: 08/29/22     Chief Complaint   Weakness   History of Present Illness   Brendan Sandoval is a 67 y.o. year-old male presents to the ED with chief complaint of right arm weakness, slurred speech, and facial droop.  He states that when he went to bed at 10pm tonight he was at his baseline.  He awakened at 1am and noticed the symptoms when he stood to use the bathroom.  The symptoms had resolved PTA at 2am.    Patient has history of SDH and is being followed at [facility] for this.  He had an appointment as recently as yesterday.  History provided by patient.   Review of Systems  Pertinent positive and negative review of systems noted in HPI.    Physical Exam   Vitals:   08/29/22 0415 08/29/22 0430  BP: 114/79 114/64  Pulse: 63 (!) 58  Resp: 16 14  Temp:    SpO2: 99% 98%    CONSTITUTIONAL:  well-appearing, NAD NEURO:  Alert and oriented x 3, CN 3-12 grossly intact, normal sensation and strength in bilateral upper and lower extremities, no drift, normal finger to nose EYES:  eyes equal and reactive ENT/NECK:  Supple, no stridor  CARDIO:  normal rate, regular rhythm, appears well-perfused  PULM:  No respiratory distress, CTAB GI/GU:  non-distended,  MSK/SPINE:  No gross deformities, no edema, moves all extremities  SKIN:  no rash, atraumatic   *Additional and/or pertinent findings included in MDM below  Diagnostic and Interventional Summary    EKG Interpretation  Date/Time:    Ventricular Rate:    PR Interval:    QRS Duration:   QT Interval:    QTC Calculation:   R Axis:     Text Interpretation:         Labs Reviewed  COMPREHENSIVE METABOLIC PANEL - Abnormal; Notable for the following components:      Result Value   CO2 20 (*)    Glucose, Bld 156 (*)    All other components within normal limits  I-STAT CHEM 8, ED - Abnormal; Notable for the following  components:   Glucose, Bld 159 (*)    Calcium, Ion 1.00 (*)    All other components within normal limits  CBG MONITORING, ED - Abnormal; Notable for the following components:   Glucose-Capillary 155 (*)    All other components within normal limits  PROTIME-INR  APTT  CBC  DIFFERENTIAL  ETHANOL  RAPID URINE DRUG SCREEN, HOSP PERFORMED    CT HEAD WO CONTRAST  Final Result      Medications  levETIRAcetam (KEPPRA) tablet 500 mg (has no administration in time range)     Procedures  /  Critical Care .Critical Care  Performed by: Roxy Horseman, PA-C Authorized by: Roxy Horseman, PA-C   Critical care provider statement:    Critical care time (minutes):  55   Critical care was necessary to treat or prevent imminent or life-threatening deterioration of the following conditions:  CNS failure or compromise   Critical care was time spent personally by me on the following activities:  Development of treatment plan with patient or surrogate, discussions with consultants, evaluation of patient's response to treatment, examination of patient, ordering and review of laboratory studies, ordering and review of radiographic studies, ordering and performing treatments and interventions, pulse oximetry, re-evaluation of patient's condition and review of old charts   ED  Course and Medical Decision Making  I have reviewed the triage vital signs, the nursing notes, and pertinent available records from the EMR.  Social Determinants Affecting Complexity of Care: Patient has no clinically significant social determinants affecting this chief complaint..   ED Course: Clinical Course as of 08/29/22 0521  Fri Aug 29, 2022  0230 Seen by and discussed with Dr. Clayborne Dana, no code stroke. [RB]    Clinical Course User Index [RB] Roxy Horseman, PA-C    Medical Decision Making Patient presented tonight with right arm weakness, slurred speech, and facial drooping.  Symptoms had resolved by the time he  arrived in the ED.  NIH was 0 on arrival.    Patient has history of subdural hematomas thought secondary to intracranial hypotension.  He is followed at [facility] for this and was seen yesterday.    He has remained asymptomatic in the ED.  See discussion with consultants below for care plan.    Amount and/or Complexity of Data Reviewed Labs: ordered.    Details: Normal platelets, no anemia No significant electrolyte abnormality Radiology: ordered and independent interpretation performed.    Details: Subdural hematoma seen  Risk Prescription drug management.     Consultants: 3:30am I consulted with Dr. Amada Jupiter, who says that neurosurgery needs to look at images, but difficult to compare since images are with [facility], where the patient is followed for this.  Images powershared with [facility], will consult with [facility] for continuity of care since patient is asymptomatic and doesn't appear to need an emergent surgery.  5:21 AM I consulted with Dr. Tempie Donning, neurosurgery at [facility], who says that no emergent surgical indication.  Recommends observation at [facility] with repeat CT imaging and neurosurg consult if change in condition.  1610 I consulted with APP Esperanza Richters, who spoke with Dr. Maisie Fus from neurosurgery at [facility], who says that patient can be discharged.  We discussed observation admission and repeat CT, but ultimately settled on repeat CT in the ED.  If repeat is worsening, will circle back up with neurosurgery.  If stable, will discharge home.  Treatment and Plan: Dispo pending at time of signout.   Plan: Follow-up on repeat CT.  Patient seen by and discussed with attending physician, Dr. Clayborne Dana, who recommends observation in ED with repeat CT at 6 hours.  Anticipate discharge if stable.  Final Clinical Impressions(s) / ED Diagnoses     ICD-10-CM   1. Subdural hematoma (HCC)  S06.5XAA       ED Discharge Orders     None         Discharge Instructions Discussed  with and Provided to Patient:   Discharge Instructions   None      Roxy Horseman, PA-C 08/29/22 [id number]    Mesner, Barbara Cower, MD 08/29/22 [phone]

## 2022-08-29 NOTE — ED Triage Notes (Signed)
PT BIB [facility] from home c/o intermittent right sided weakness and slurred speech that started at 1 am. symptoms resolved at this time. No other neuro deficits for EMS. Ambulatory on scene. NIH 0 at this time in triage. Similar s/s in the past. Hx subdural hematoma, unknown dates/specifics. No blood thinner. No recent falls. A&Ox4.

## 2022-08-29 NOTE — ED Provider Notes (Signed)
Accepted handoff at shift change from Roxy Horseman, PA-C. Please see prior provider note for more detail.   Briefly: Patient is 67 y.o. with a known history of chronic subdural hematomas.  He woke up last night with slurred speech, facial droop, and right arm weakness that had resolved by the time he arrived to the ED.  He is followed by [facility] neurosurgery.  CT scan showed acute on chronic subdural hematomas worse on the left side.  Case was discussed with [facility] neurosurgery and [facility] who recommended repeat head CT. If stable patient can be discharged home as no intervention is required at this time per previous PA-C and attending physician.  Patient pending repeat head CT at 0800.  If this is stable and patient remains asymptomatic, he can be discharged home.  Initial CT brain IMPRESSION: Bilateral subdural hematomas left greater than right as described. The right-sided subdural is acute in nature. The left has a more acute on chronic appearance. No significant midline shift is noted.   Critical Value/emergent results were called by telephone at the time of interpretation on 08/29/2022 at 3:02 am to [facility], PA , who verbally acknowledged these results.  Repeat CT brain CLINICAL DATA:  Stroke follow-up   EXAM: CT HEAD WITHOUT CONTRAST   TECHNIQUE: Contiguous axial images were obtained from the base of the skull through the vertex without intravenous contrast.   RADIATION DOSE REDUCTION: This exam was performed according to the departmental dose-optimization program which includes automated exposure control, adjustment of the mA and/or kV according to patient size and/or use of iterative reconstruction technique.   COMPARISON:  Earlier today   FINDINGS: Brain: Mixed density subdural hematoma along the bilateral cerebral convexity are non progressed. On the left thickness measures up to 16 mm. On the right thickness is 6 mm. No brain swelling,  infarct, hydrocephalus, or midline shift.   Vascular: No hyperdense vessel or unexpected calcification.   Skull: Normal. Negative for fracture or focal lesion.   Sinuses/Orbits: No acute finding.   IMPRESSION: Size stable mixed density subdural hematoma along the left more than right cerebral convexity when compared to scan earlier the same day.     Electronically Signed   By: Tiburcio Pea M.D.   On: 08/29/2022 08:42  0845 - Patient has remained stable throughout ED stay. No symptoms since episode last night. At neurological baseline. Discussed all findings with pt/partner at bedside including repeat stable head CT and need for close follow up with neurosurgery which they expressed understanding of. Strict ED return precautions given, all questions answered, and stable for discharge.        Tonette Lederer, PA-C 08/29/22 0900    Eber Hong, MD 08/30/22 [phone]

## 2022-11-28 IMAGING — CT CT RENAL STONE PROTOCOL
3 of 4 series · 8 of 46 positions shown, 15 images · non-contrast
Comparison: None.

CLINICAL DATA: Left flank pain.

EXAM:
CT ABDOMEN AND PELVIS WITHOUT CONTRAST
TECHNIQUE: Multidetector CT imaging of the abdomen and pelvis was performed
following the standard protocol without IV contrast.

[Series 4: lungs · axial · 0.62mm/px · z∈[+1069,+1124]mm · 4 of 19 slices shown, 9 images]
[im 4/19  soft-tissue]
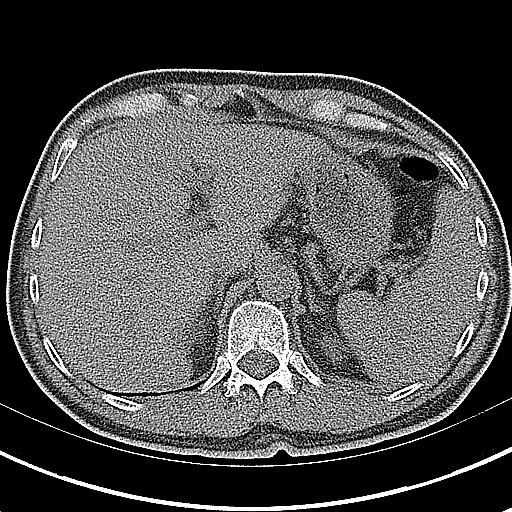
[im 4/19  lung]
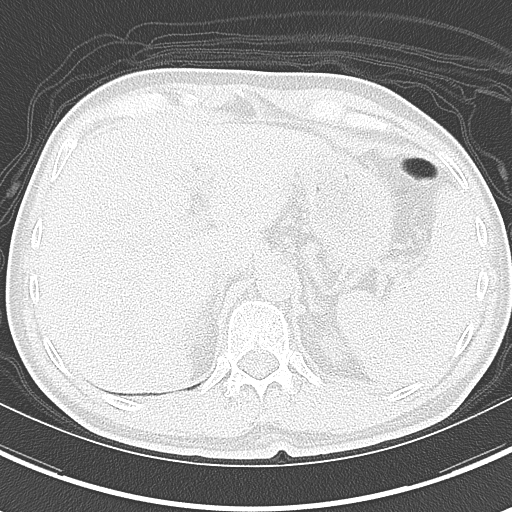
[im 4/19  bone]
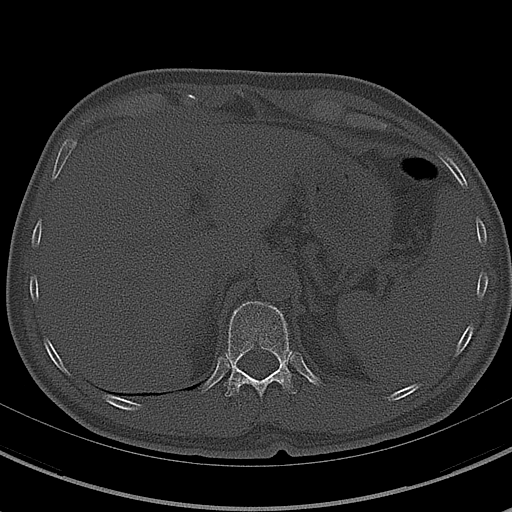
[im 8/19  soft-tissue]
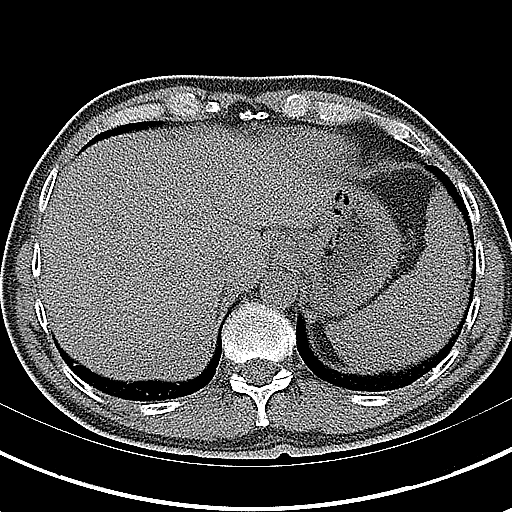
[im 8/19  lung]
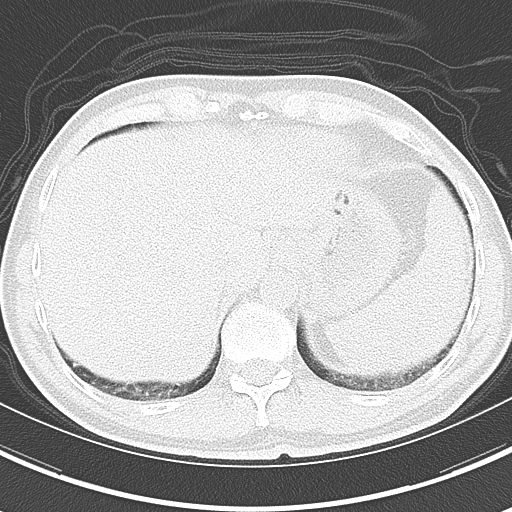
[im 11/19  soft-tissue]
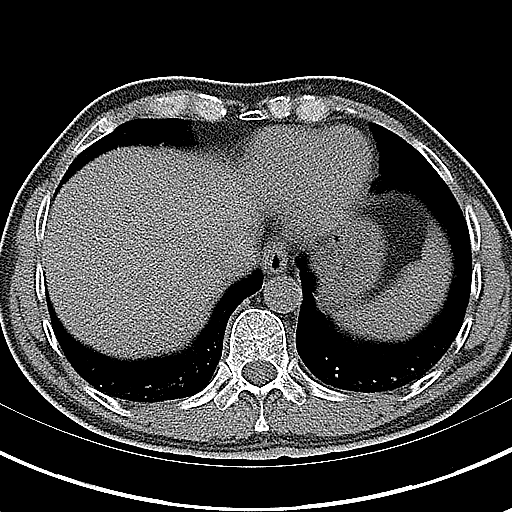
[im 11/19  lung]
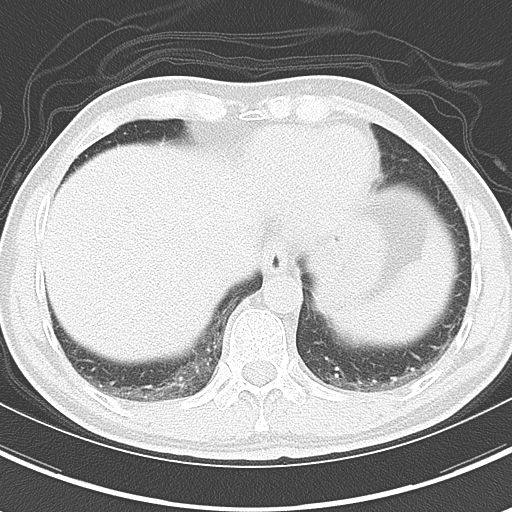
[im 15/19  soft-tissue]
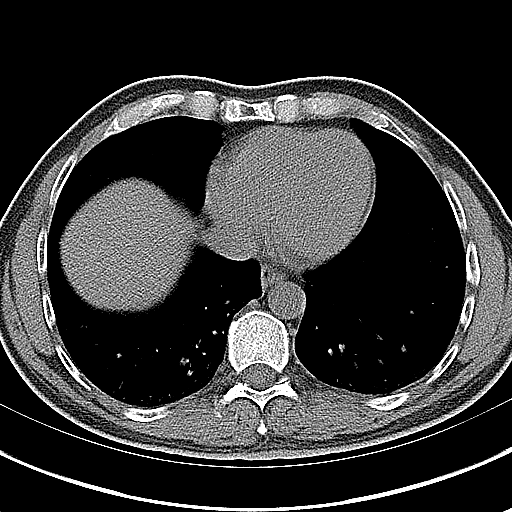
[im 15/19  lung]
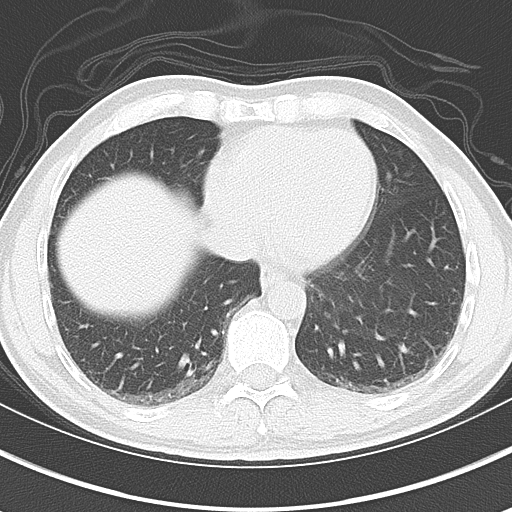

[Series 5: coronal · coronal · 0.69mm/px · 3 of 85 slices shown, 4 images]
[im 29/85  soft-tissue]
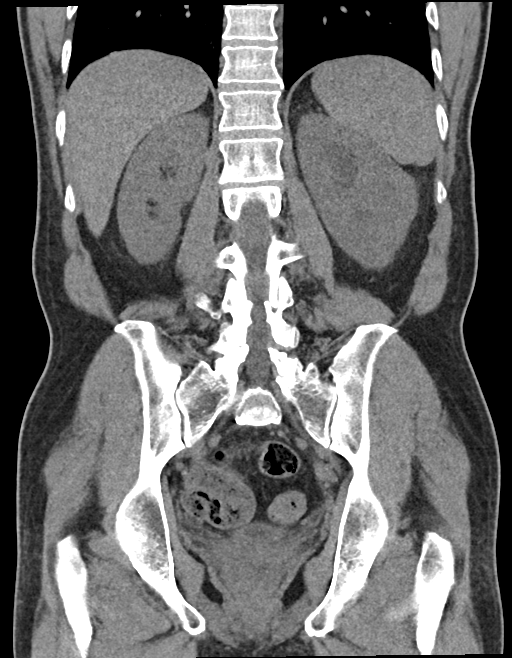
[im 38/85  soft-tissue]
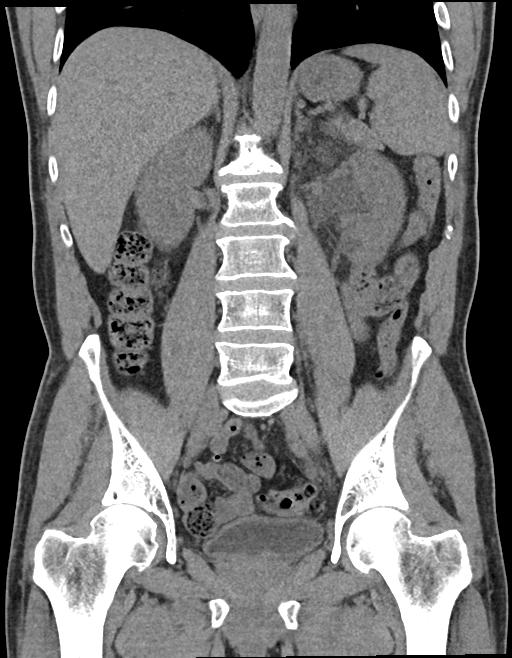
[im 38/85  bone]
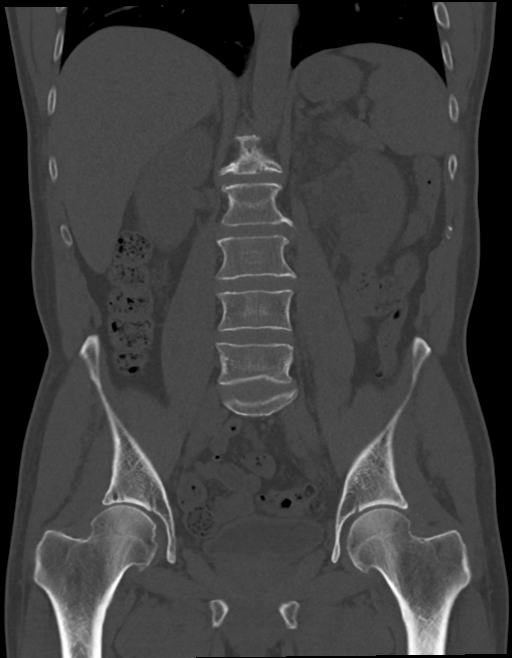
[im 47/85  soft-tissue]
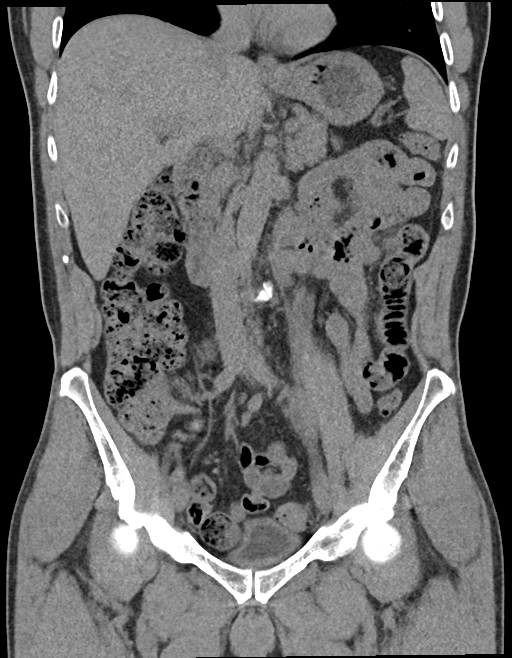

[Series 6: sagittal · sagittal · 0.50mm/px · 1 of 119 slices shown, 2 images]
[im 40/119  soft-tissue]
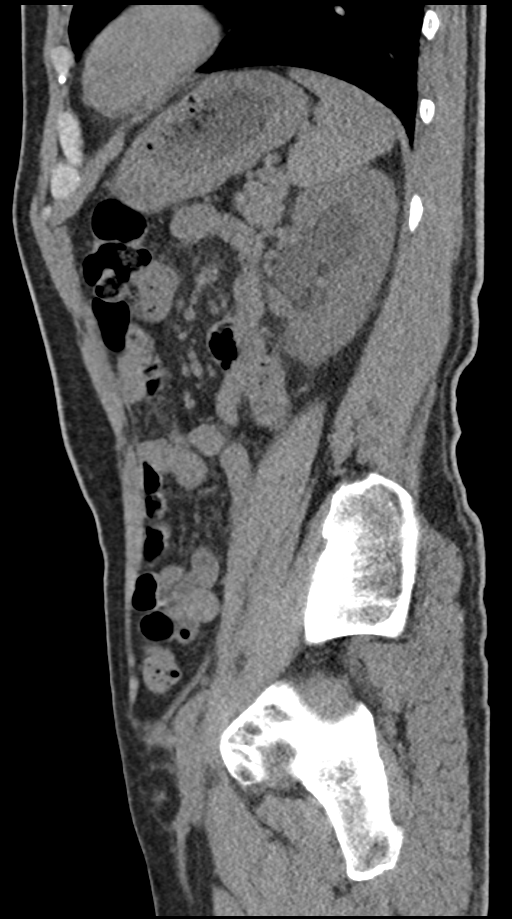
[im 40/119  bone]
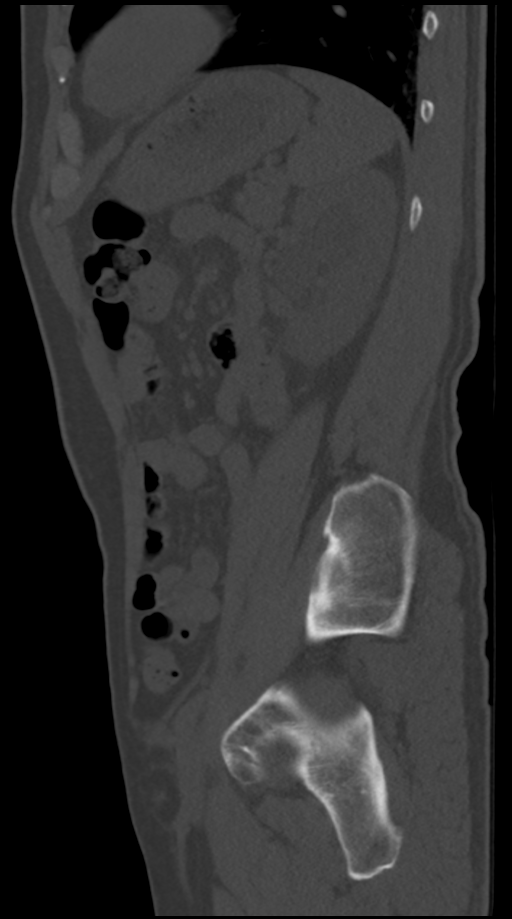

[8 of 46 positions shown; findings below may reference images not displayed]

FINDINGS: Lower chest: There is a 4 mm noncalcified pleural-based left lower
lobe nodule laterally on the first image. Lung bases are otherwise
clear. The cardiac size is normal.

Hepatobiliary: 18.3 cm in length slightly steatotic and otherwise
unremarkable without contrast. The gallbladder and bile ducts are
unremarkable.

Pancreas: Unremarkable without contrast.

Spleen: Mildly prominent measuring 13.4 cm AP and otherwise are
without contrast.

Adrenals/Urinary Tract: There is no adrenal mass. No focal
abnormality of unenhanced renal cortex aside from a 2.2 cm cyst of
the right inferior pole of 8.6 Hounsfield units, a 1.7 cm cyst in
the superior pole left kidney of 11.5 Hounsfield units, and a 1.1 cm
cyst in the inferior pole left kidney of 5.9 Hounsfield units.

On the left there is perinephric edema and moderate
hydroureteronephrosis above a 6.6 x 5 by 7 mm stone in the pelvic
left ureter 5-6 cm proximal to the UVJ, with mild periureteral and
peripelvic edema. There are no other visible urinary stones. There
is mild bladder thickening versus nondistention. No focal bladder
thickening is seen.

Stomach/Bowel: No dilatation or wall thickening including the
appendix. Moderate fecal stasis. Left colonic diverticulosis is seen
without evidence of acute diverticulitis.

Vascular/Lymphatic: No significant vascular findings are present. No
enlarged abdominal or pelvic lymph nodes.

Reproductive: Enlarged prostate, 5.3 cm transverse with bladder base
impression.

Other: There is trace retroperitoneal fluid on the left which is
probably related to obstructive uropathy. Possible ureteral leak
could cause this as well but there is no further free fluid. There
is a small umbilical fat hernia. There is no free air or free
hemorrhage

Musculoskeletal: No worrisome regional bone lesions. There is a bone
island in the left femoral head. There are mild degenerative changes
in the lumbar spine.
IMPRESSION: 1. 6.6 x 5 x 7 mm left ureteral stone in the pelvis 5-6 cm proximal
to the UVJ.
2. Moderate upstream obstructive uropathy with peripelvic and
periureteral edema with perinephric edema. Please correlate
clinically for infectious complication.
3. Trace left retroperitoneal fluid which could be due to the
obstructive uropathy but could also be seen with a ureteral leak,
although the fluid volume should be greater with a ureteral leak.
4. Prostatomegaly with bladder base impression and mild bladder
thickening versus nondistention.
5. Constipation with diverticulosis.
6. Renal cysts.  No other urinary stone is seen in either side.
7. Mildly prominent liver and spleen.
8. 4 mm noncalcified left lower lobe nodule. No imaging follow-up is
required in a low risk patient. In a high-risk patient, optional 12
month follow-up CT could be considered to ensure stability.

## 2023-09-03 ENCOUNTER — Other Ambulatory Visit: Payer: Self-pay

## 2023-09-03 ENCOUNTER — Emergency Department (HOSPITAL_BASED_OUTPATIENT_CLINIC_OR_DEPARTMENT_OTHER)

## 2023-09-03 ENCOUNTER — Encounter (HOSPITAL_BASED_OUTPATIENT_CLINIC_OR_DEPARTMENT_OTHER): Payer: Self-pay | Admitting: Emergency Medicine

## 2023-09-03 ENCOUNTER — Emergency Department (HOSPITAL_BASED_OUTPATIENT_CLINIC_OR_DEPARTMENT_OTHER)
Admission: EM | Admit: 2023-09-03 | Discharge: 2023-09-03 | Disposition: A | Discharge status: Home / Self Care | Attending: Emergency Medicine | Admitting: Emergency Medicine

## 2023-09-03 DIAGNOSIS — R930 Abnormal findings on diagnostic imaging of skull and head, not elsewhere classified: Secondary | ICD-10-CM | POA: Diagnosis not present

## 2023-09-03 DIAGNOSIS — E119 Type 2 diabetes mellitus without complications: Secondary | ICD-10-CM | POA: Insufficient documentation

## 2023-09-03 DIAGNOSIS — I10 Essential (primary) hypertension: Secondary | ICD-10-CM | POA: Insufficient documentation

## 2023-09-03 DIAGNOSIS — Z7984 Long term (current) use of oral hypoglycemic drugs: Secondary | ICD-10-CM | POA: Diagnosis not present

## 2023-09-03 DIAGNOSIS — Z79899 Other long term (current) drug therapy: Secondary | ICD-10-CM | POA: Insufficient documentation

## 2023-09-03 LAB — COMPREHENSIVE METABOLIC PANEL WITH GFR
ALT: 22 U/L (ref 0–44)
AST: 21 U/L (ref 15–41)
Albumin: 4.2 g/dL (ref 3.5–5.0)
Alkaline Phosphatase: 54 U/L (ref 38–126)
Anion gap: 14 (ref 5–15)
BUN: 9 mg/dL (ref 8–23)
CO2: 22 mmol/L (ref 22–32)
Calcium: 9.6 mg/dL (ref 8.9–10.3)
Chloride: 99 mmol/L (ref 98–111)
Creatinine, Ser: 0.95 mg/dL (ref 0.61–1.24)
GFR, Estimated: >60 mL/min (ref >60)
Glucose, Bld: 175 mg/dL — ABNORMAL HIGH (ref 70–99)
Potassium: 4.2 mmol/L (ref 3.5–5.1)
Sodium: 135 mmol/L (ref 135–145)
Total Bilirubin: 0.5 mg/dL (ref 0.0–1.2)
Total Protein: 7.5 g/dL (ref 6.5–8.1)

## 2023-09-03 LAB — LIPASE, BLOOD: Lipase: 19 U/L (ref 11–51)

## 2023-09-03 LAB — CBC WITH DIFFERENTIAL/PLATELET
Abs Immature Granulocytes: 0.02 10*3/uL (ref 0.00–0.07)
Basophils Absolute: 0 10*3/uL (ref 0.0–0.1)
Basophils Relative: 0 %
Eosinophils Absolute: 0 10*3/uL (ref 0.0–0.5)
Eosinophils Relative: 0 %
HCT: 43.3 % (ref 39.0–52.0)
Hemoglobin: 14.5 g/dL (ref 13.0–17.0)
Immature Granulocytes: 0 %
Lymphocytes Relative: 35 %
Lymphs Abs: 2.6 10*3/uL (ref 0.7–4.0)
MCH: 28.3 pg (ref 26.0–34.0)
MCHC: 33.5 g/dL (ref 30.0–36.0)
MCV: 84.6 fL (ref 80.0–100.0)
Monocytes Absolute: 0.6 10*3/uL (ref 0.1–1.0)
Monocytes Relative: 7 %
Neutro Abs: 4.3 10*3/uL (ref 1.7–7.7)
Neutrophils Relative %: 58 %
Platelets: 164 10*3/uL (ref 150–400)
RBC: 5.12 MIL/uL (ref 4.22–5.81)
RDW: 14.1 % (ref 11.5–15.5)
WBC: 7.6 10*3/uL (ref 4.0–10.5)
nRBC: 0 % (ref 0.0–0.2)

## 2023-09-03 LAB — URINALYSIS, ROUTINE W REFLEX MICROSCOPIC
Bilirubin Urine: NEGATIVE
Glucose, UA: NEGATIVE mg/dL
Hgb urine dipstick: NEGATIVE
Ketones, ur: NEGATIVE mg/dL
Leukocytes,Ua: NEGATIVE
Nitrite: NEGATIVE
Protein, ur: NEGATIVE mg/dL
Specific Gravity, Urine: <1.005 — ABNORMAL LOW (ref 1.005–1.030)
pH: 6 (ref 5.0–8.0)

## 2023-09-03 LAB — TROPONIN T, HIGH SENSITIVITY: Troponin T High Sensitivity: <15 ng/L (ref <19)

## 2023-09-03 NOTE — ED Provider Notes (Signed)
 [place] EMERGENCY DEPARTMENT AT [facility] Provider Note   CSN: [id number] Arrival date & time: 09/03/23  0865     History  Chief Complaint  Patient presents with   Hypertension    Brendan Sandoval is a 68 y.o. male.  Pt is a 68 yo male with pmhx significant for DM, HTN, and SDH.  Pt said he had some abdominal pain last night and checked his bp.  SBP was in the 160s.  He rested and it came down.  He has no hx of HTN.  He said the abd pain has improved, but is still there a little bit, so he checked his bp again and it was still elevated.  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  He denies any neuro sx.  No n/v.  No dizziness. No cp.       Home Medications Prior to Admission medications   Medication Sig Start Date End Date Taking? Authorizing Provider  oxyCODONE -acetaminophen  (PERCOCET/ROXICET) 5-325 MG tablet Take 1 tablet by mouth every 6 (six) hours as needed for severe pain. 02/20/21   Horton, Vonzella Guernsey, MD  rosuvastatin (CRESTOR) 10 MG tablet Take 10 mg by mouth every evening. 05/05/18   [provider]  SitaGLIPtin-MetFORMIN HCl (JANUMET XR) [phone] MG TB24 Take 500 mg by mouth 2 (two) times daily. 02/25/18   [provider]  tamsulosin  (FLOMAX ) 0.4 MG CAPS capsule Take 1 capsule (0.4 mg total) by mouth daily. Patient not taking: Reported on 02/20/2021 02/20/21   Horton, Vonzella Guernsey, MD      Allergies    Patient has no known allergies.    Review of Systems   Review of Systems  Gastrointestinal:  Positive for abdominal pain.  Neurological:  Positive for headaches.  All other systems reviewed and are negative.   Physical Exam Updated Vital Signs BP (!) 163/87 (BP Location: Right Arm)   Pulse 87   Temp 98.1 F (36.7 C) (Oral)   Resp 18   SpO2 99%  Physical Exam Vitals and nursing note reviewed.  Constitutional:      Appearance: Normal appearance.  HENT:     Head: Normocephalic and atraumatic.     Right Ear: External ear normal.      Left Ear: External ear normal.     Nose: Nose normal.     Mouth/Throat:     Mouth: Mucous membranes are moist.     Pharynx: Oropharynx is clear.   Eyes:     Extraocular Movements: Extraocular movements intact.     Conjunctiva/sclera: Conjunctivae normal.     Pupils: Pupils are equal, round, and reactive to light.    Cardiovascular:     Rate and Rhythm: Normal rate and regular rhythm.     Pulses: Normal pulses.     Heart sounds: Normal heart sounds.  Pulmonary:     Effort: Pulmonary effort is normal.     Breath sounds: Normal breath sounds.  Abdominal:     General: Abdomen is flat.     Palpations: Abdomen is soft.   Musculoskeletal:        General: Normal range of motion.     Cervical back: Normal range of motion and neck supple.   Skin:    General: Skin is warm.     Capillary Refill: Capillary refill takes less than 2 seconds.   Neurological:     General: No focal deficit present.     Mental Status: He is alert and oriented to person,  place, and time.   Psychiatric:        Mood and Affect: Mood normal.        Behavior: Behavior normal.     ED Results / Procedures / Treatments   Labs (all labs ordered are listed, but only abnormal results are displayed) Labs Reviewed  COMPREHENSIVE METABOLIC PANEL WITH GFR - Abnormal; Notable for the following components:      Result Value   Glucose, Bld 175 (*)    All other components within normal limits  URINALYSIS, ROUTINE W REFLEX MICROSCOPIC - Abnormal; Notable for the following components:   Color, Urine COLORLESS (*)    Specific Gravity, Urine <1.005 (*)    All other components within normal limits  CBC WITH DIFFERENTIAL/PLATELET  LIPASE, BLOOD  TROPONIN T, HIGH SENSITIVITY    EKG None  Radiology DG Chest 2 View Result Date: 09/03/2023 CLINICAL DATA:  Headache.  Mid back pain. EXAM: CHEST - 2 VIEW COMPARISON:  No comparison studies available. FINDINGS: The heart size and mediastinal contours are within normal  limits. Both lungs are clear. The visualized skeletal structures are unremarkable. IMPRESSION: No active cardiopulmonary disease. Electronically Signed   By: Donnal Fusi M.D.   On: 09/03/2023 08:03   CT Head Wo Contrast Result Date: 09/03/2023 CLINICAL DATA:  Headache, new onset.  Hypertension. EXAM: CT HEAD WITHOUT CONTRAST TECHNIQUE: Contiguous axial images were obtained from the base of the skull through the vertex without intravenous contrast. RADIATION DOSE REDUCTION: This exam was performed according to the departmental dose-optimization program which includes automated exposure control, adjustment of the mA and/or kV according to patient size and/or use of iterative reconstruction technique. COMPARISON:  CT head without contrast 08/29/2022. FINDINGS: Brain: Mild atrophy and white matter changes are similar the prior study. Previously seen subdural hemorrhage has resolved. No acute infarct, hemorrhage, or mass lesion is present. No significant extra-axial collection is present on today's study. Deep brain nuclei are within normal limits. The brainstem and cerebellum are within normal limits. Midline structures are within normal limits. Vascular: No hyperdense vessel or unexpected calcification. 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 Skull: Calvarium is intact. No focal lytic or blastic lesions are present. No significant extracranial soft tissue lesion is present. Sinuses/Orbits: The paranasal sinuses and mastoid air cells are clear. The globes and orbits are within normal limits. IMPRESSION: 1. No acute intracranial abnormality or significant interval change. 2. Mild atrophy and white matter disease. This likely reflects the sequela of chronic microvascular ischemia. 3. Interval resolution of previously seen subdural hemorrhage. 4. Middle meningeal artery embolization bilaterally. Electronically Signed   By: Audree Leas M.D.   On: 09/03/2023 07:52    Procedures Procedures     Medications Ordered in ED Medications - No data to display  ED Course/ Medical Decision Making/ A&P                                 Medical Decision Making Amount and/or Complexity of Data Reviewed Labs: ordered. Radiology: ordered.   This patient presents to the ED for concern of htn, this involves an extensive number of treatment options, and is a complaint that carries with it a high risk of complications and morbidity.  The differential diagnosis includes essential htn, pain, sdh worsening, cardiac event   Co morbidities that complicate the patient evaluation  DM, HTN, and SDH.   Additional history obtained:  Additional history obtained from epic chart  review  Lab Tests:  I Ordered, and personally interpreted labs.  The pertinent results include:  cbc nl, cmp nl other than glucose elevated at 175, lip nl, trop nl, ua nl   Imaging Studies ordered:  I ordered imaging studies including cxr, ct head  I independently visualized and interpreted imaging which showed  CXR: No active cardiopulmonary disease.  CT head: No acute intracranial abnormality or significant interval change.  2. Mild atrophy and white matter disease. This likely reflects the  sequela of chronic microvascular ischemia.  3. Interval resolution of previously seen subdural hemorrhage.  4. Middle meningeal artery embolization bilaterally.   I agree with the radiologist interpretation   Cardiac Monitoring:  The patient was maintained on a cardiac monitor.  I personally viewed and interpreted the cardiac monitored which showed an underlying rhythm of: nsr   Medicines ordered and prescription drug management:   I have reviewed the patients home medicines and have made adjustments as needed   Test Considered:  ct   Problem List / ED Course:  HTN: bp is down to 140 with rest.  Pt has normal bp normally.  No indication for bp meds now.  He is encouraged to eat a low sodium diet.  Is abd  pain is better.   Reevaluation:  After the interventions noted above, I reevaluated the patient and found that they have :improved   Social Determinants of Health:  Lives at home   Dispostion:  After consideration of the diagnostic results and the patients response to treatment, I feel that the patent would benefit from discharge with outpatient f/u.          Final Clinical Impression(s) / ED Diagnoses Final diagnoses:  Hypertension, unspecified type    Rx / DC Orders ED Discharge Orders     None         Sueellen Emery, MD 09/03/23 0830

## 2023-09-03 NOTE — ED Triage Notes (Signed)
 States took BP this morning and was around 160s/90s. No hx of NTH. States he had some mild back pain and headache.
